# Patient Record
Sex: Male | Born: 1985
Health system: Southern US, Community
[De-identification: ages and names within clinical notes are randomized; demographics above are authoritative.]

---

## 2015-12-02 ENCOUNTER — Encounter: Payer: Self-pay | Admitting: Orthopaedic Surgery

## 2015-12-02 ENCOUNTER — Ambulatory Visit (INDEPENDENT_AMBULATORY_CARE_PROVIDER_SITE_OTHER): Payer: 59 | Admitting: Orthopaedic Surgery

## 2015-12-02 VITALS — BP 115/70 | HR 77 | Temp 98.1°F | Ht 74.0 in | Wt 190.0 lb

## 2015-12-02 DIAGNOSIS — S66911A Strain of unspecified muscle, fascia and tendon at wrist and hand level, right hand, initial encounter: Secondary | ICD-10-CM

## 2015-12-02 NOTE — Progress Notes (Signed)
Subjective: I hurt my right forearm and wrist while pulling on fire hose about ten days ago    Patient ID: Phillip Snyder, male    DOB: 1985/10/29, 30 y.o.   MRN: [id number]  Wrist Pain  The pain is present in the right arm and right wrist. This is a new problem. The current episode started 1 to 4 weeks ago. There has been a history of trauma. The problem occurs intermittently. The problem has been gradually improving. The quality of the pain is described as aching. The pain is at a severity of 2/10. The pain is mild. The symptoms are aggravated by activity. He has tried nothing for the symptoms.   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  About ten days ago, he helped pull about 2000 feet of fire hose out and lay it on the ground for inspection.  He had to pull on it very hard.  He did well doing it but later that evening he had dorsal swelling of the right dominant forearm between wrist and elbow.  He had some tenderness.  He just watched it.  He did not use ice or heat.  He did take one or two Aleve total.  Over the last ten days the area has gone done but he is concerned he did something bad and wants it checked out.  He has never had this before.    He has no numbness, no loss of motion, no redness, no joint pain.  He has no pain to the right elbow or upper arm or shoulder.    Review of Systems  HENT: Negative for congestion.   Respiratory: Negative for cough and shortness of breath.   Cardiovascular: Negative for chest pain and leg swelling.  Endocrine: Negative for cold intolerance.  Musculoskeletal: Positive for arthralgias.  Allergic/Immunologic: Negative for environmental allergies.  All other systems reviewed and are negative.  History reviewed. No pertinent past medical history.  History reviewed. No pertinent past surgical history.  No current outpatient prescriptions on file prior to visit.   No current facility-administered medications on file prior  to visit.    Social History   Social History  . Marital Status: Unknown    Spouse Name: N/A  . Number of Children: N/A  . Years of Education: N/A   Occupational History  . Not on file.   Social History Main Topics  . Smoking status: Not on file  . Smokeless tobacco: Not on file  . Alcohol Use: Not on file  . Drug Use: Not on file  . Sexual Activity: Not on file   Other Topics Concern  . Not on file   Social History Narrative  . No narrative on file    BP 115/70 mmHg  Pulse 77  Temp(Src) 98.1 F (36.7 C)  Ht  (1.88 m)  Wt 190 lb (86.183 kg)  BMI 24.38 kg/m2     Objective:   Physical Exam  Constitutional: He is oriented to person, place, and time. He appears well-developed and well-nourished.  HENT:  Head: Normocephalic and atraumatic.  Eyes: Conjunctivae and EOM are normal. Pupils are equal, round, and reactive to light.  Neck: Normal range of motion. Neck supple.  Cardiovascular: Normal rate, regular rhythm and intact distal pulses.   Pulmonary/Chest: Effort normal.  Abdominal: Soft.  Musculoskeletal: He exhibits tenderness (He has slight dorsal swelling of the right forearm with his slight tenderness sensation felt with reisitance of wrist extensors.  He has normal  NV status.  Grips are normal.  ROM full.).  Neurological: He is alert and oriented to person, place, and time. He has normal reflexes. No cranial nerve deficit. He exhibits normal muscle tone. Coordination normal.  Skin: Skin is warm and dry.  Psychiatric: He has a normal mood and affect. His behavior is normal. Judgment and thought content normal.          Assessment & Plan:   Encounter Diagnosis  Name Primary?  . Strain of right wrist, initial encounter Yes   I believe he has strain of the extensor carpi radialis longus and brevis on the right (second compartment of right hand extensors) that is resolving.  I have shown him drawings of the area.  I have told him to be careful with  any strenuous pulling over the next few weeks.  Use ice and Aleve if he has a problem.  I will see him as needed.  Call if any problem.

## 2015-12-02 NOTE — Patient Instructions (Signed)
Use ice and take aleve one two times a day Avoid pulling activity for the next ten days

## 2016-07-05 ENCOUNTER — Ambulatory Visit (HOSPITAL_COMMUNITY)
Admission: EM | Admit: 2016-07-05 | Discharge: 2016-07-05 | Disposition: A | Discharge status: Home / Self Care | Payer: 59 | Attending: Family Medicine | Admitting: Family Medicine

## 2016-07-05 ENCOUNTER — Encounter (HOSPITAL_COMMUNITY): Payer: Self-pay | Admitting: Emergency Medicine

## 2016-07-05 DIAGNOSIS — J069 Acute upper respiratory infection, unspecified: Secondary | ICD-10-CM | POA: Diagnosis not present

## 2016-07-05 MED ORDER — IPRATROPIUM BROMIDE 0.06 % NA SOLN: 2.0000 | Freq: Four times a day (QID) | NASAL | 1 refills | Status: AC

## 2016-07-05 NOTE — ED Triage Notes (Signed)
[facility] a [facility] of a sore throat , runny nose and sinus pressure x 3 days.

## 2016-07-05 NOTE — Discharge Instructions (Signed)
Drink plenty of fluids as discussed, use medicine as prescribed, and mucinex or delsym for cough. Return or see your doctor if further problems °

## 2016-07-05 NOTE — ED Provider Notes (Signed)
[facility]    CSN: [id number] Arrival date & time: 07/05/16  1929     History   Chief Complaint Chief Complaint  Patient presents with  . Sore Throat    HPI Phillip Snyder is a 30 y.o. male.   The history is provided by the patient.  Sore Throat  This is a new problem. The current episode started more than 2 days ago. The problem has been gradually worsening. Pertinent negatives include no chest pain, no abdominal pain, no headaches and no shortness of breath.    History reviewed. No pertinent past medical history.  There are no active problems to display for this patient.   History reviewed. No pertinent surgical history.     Home Medications    Prior to Admission medications   Medication Sig Start Date End Date Taking? Authorizing Provider  fexofenadine (ALLEGRA) 180 MG tablet Take 180 mg by mouth daily.   Yes Historical Provider, MD  ipratropium (ATROVENT) 0.06 % nasal spray Place 2 sprays into both nostrils 4 (four) times daily. 07/05/16   Linna HoffJames D Kindl, MD    Family History History reviewed. No pertinent family history.  Social History Social History  Substance Use Topics  . Smoking status: Never Smoker  . Smokeless tobacco: Never Used  . Alcohol use No     Allergies   Patient has no known allergies.   Review of Systems Review of Systems  Constitutional: Negative for activity change, appetite change, chills and fever.  HENT: Positive for congestion, postnasal drip, rhinorrhea and sore throat.   Respiratory: Negative.  Negative for shortness of breath.   Cardiovascular: Negative.  Negative for chest pain.  Gastrointestinal: Negative.  Negative for abdominal pain.  Neurological: Negative for headaches.  All other systems reviewed and are negative.    Physical Exam Triage Vital Signs ED Triage Vitals  Enc Vitals Group     BP 07/05/16 2002 123/71     Pulse Rate 07/05/16 2002 77     Resp 07/05/16 2002 16     Temp 07/05/16  2002 98.3 F (36.8 C)     Temp Source 07/05/16 2002 Oral     SpO2 07/05/16 2002 100 %     Weight --      Height --      Head Circumference --      Peak Flow --      Pain Score 07/05/16 2005 5     Pain Loc --      Pain Edu? --      Excl. in GC? --    No data found.   Updated Vital Signs BP 123/71 (BP Location: Right Arm)   Pulse 77   Temp 98.3 F (36.8 C) (Oral)   Resp 16   SpO2 100%   Visual Acuity Right Eye Distance:   Left Eye Distance:   Bilateral Distance:    Right Eye Near:   Left Eye Near:    Bilateral Near:     Physical Exam  Constitutional: He is oriented to person, place, and time. He appears well-developed and well-nourished.  HENT:  Right Ear: External ear normal.  Left Ear: External ear normal.  Nose: Nose normal.  Mouth/Throat: Oropharynx is clear and moist.  Eyes: Pupils are equal, round, and reactive to light.  Neck: Normal range of motion. Neck supple.  Cardiovascular: Normal rate, regular rhythm, normal heart sounds and intact distal pulses.   Pulmonary/Chest: Effort normal and breath sounds normal.  Lymphadenopathy:  He has no cervical adenopathy.  Neurological: He is alert and oriented to person, place, and time.  Skin: Skin is warm and dry.  Nursing note and vitals reviewed.    UC Treatments / Results  Labs (all labs ordered are listed, but only abnormal results are displayed) Labs Reviewed - No data to display  EKG  EKG Interpretation None       Radiology No results found.  Procedures Procedures (including critical care time)  Medications Ordered in UC Medications - No data to display   Initial Impression / Assessment and Plan / UC Course  I have reviewed the triage vital signs and the nursing notes.  Pertinent labs & imaging results that were available during my care of the patient were reviewed by me and considered in my medical decision making (see chart for details).  Clinical Course      Final Clinical  Impressions(s) / UC Diagnoses   Final diagnoses:  Upper respiratory tract infection, unspecified type    New Prescriptions Discharge Medication List as of 07/05/2016  8:26 PM    START taking these medications   Details  ipratropium (ATROVENT) 0.06 % nasal spray Place 2 sprays into both nostrils 4 (four) times daily., Starting Tue 07/05/2016, Print         Linna HoffJames D Kindl, MD 07/10/16 [phone]

## 2016-09-12 ENCOUNTER — Emergency Department (HOSPITAL_COMMUNITY)
Admission: EM | Admit: 2016-09-12 | Discharge: 2016-09-12 | Disposition: A | Discharge status: Home / Self Care | Payer: Worker's Compensation | Attending: Emergency Medicine | Admitting: Emergency Medicine

## 2016-09-12 ENCOUNTER — Emergency Department (HOSPITAL_COMMUNITY): Payer: Worker's Compensation

## 2016-09-12 ENCOUNTER — Encounter (HOSPITAL_COMMUNITY): Payer: Self-pay | Admitting: Emergency Medicine

## 2016-09-12 DIAGNOSIS — S6710XA Crushing injury of unspecified finger(s), initial encounter: Secondary | ICD-10-CM

## 2016-09-12 DIAGNOSIS — S61316A Laceration without foreign body of right little finger with damage to nail, initial encounter: Secondary | ICD-10-CM | POA: Insufficient documentation

## 2016-09-12 DIAGNOSIS — W269XXA Contact with unspecified sharp object(s), initial encounter: Secondary | ICD-10-CM | POA: Diagnosis not present

## 2016-09-12 DIAGNOSIS — Y990 Civilian activity done for income or pay: Secondary | ICD-10-CM | POA: Diagnosis not present

## 2016-09-12 DIAGNOSIS — Y9289 Other specified places as the place of occurrence of the external cause: Secondary | ICD-10-CM | POA: Diagnosis not present

## 2016-09-12 DIAGNOSIS — Y9389 Activity, other specified: Secondary | ICD-10-CM | POA: Diagnosis not present

## 2016-09-12 DIAGNOSIS — R52 Pain, unspecified: Secondary | ICD-10-CM

## 2016-09-12 DIAGNOSIS — S61319A Laceration without foreign body of unspecified finger with damage to nail, initial encounter: Secondary | ICD-10-CM

## 2016-09-12 DIAGNOSIS — T148XXA Other injury of unspecified body region, initial encounter: Secondary | ICD-10-CM

## 2016-09-12 LAB — I-STAT CHEM 8, ED
BUN: 13 mg/dL (ref 6–20)
Calcium, Ion: 1.15 mmol/L (ref 1.15–1.40)
Chloride: 102 mmol/L (ref 101–111)
Creatinine, Ser: 0.9 mg/dL (ref 0.61–1.24)
Glucose, Bld: 106 mg/dL — ABNORMAL HIGH (ref 65–99)
HEMATOCRIT: 48 % (ref 39.0–52.0)
HEMOGLOBIN: 16.3 g/dL (ref 13.0–17.0)
Potassium: 4 mmol/L (ref 3.5–5.1)
SODIUM: 141 mmol/L (ref 135–145)
TCO2: 31 mmol/L (ref 0–100)

## 2016-09-12 MED ORDER — ACETAMINOPHEN 500 MG PO TABS: 1000.0000 mg | ORAL_TABLET | Freq: Three times a day (TID) | ORAL | 0 refills | Status: AC

## 2016-09-12 MED ORDER — CEFAZOLIN IN D5W 1 GM/50ML IV SOLN
1.0000 g | Freq: Once | INTRAVENOUS | Status: AC
  Administered 2016-09-12: 1 g via INTRAVENOUS
  Filled 2016-09-12: qty 50

## 2016-09-12 MED ORDER — ACETAMINOPHEN 500 MG PO TABS
1000.0000 mg | ORAL_TABLET | Freq: Once | ORAL | Status: AC
  Administered 2016-09-12: 1000 mg via ORAL
  Filled 2016-09-12: qty 2

## 2016-09-12 MED ORDER — LIDOCAINE HCL 1 % IJ SOLN
10.0000 mL | Freq: Once | INTRAMUSCULAR | Status: AC
  Administered 2016-09-12: 10 mL via INTRADERMAL
  Filled 2016-09-12: qty 10

## 2016-09-12 MED ORDER — CEPHALEXIN 500 MG PO CAPS: 500.0000 mg | ORAL_CAPSULE | Freq: Three times a day (TID) | ORAL | 0 refills | Status: AC

## 2016-09-12 MED ORDER — HYDROCODONE-ACETAMINOPHEN 5-325 MG PO TABS: 1.0000 | ORAL_TABLET | Freq: Three times a day (TID) | ORAL | 0 refills | Status: AC | PRN

## 2016-09-12 MED ORDER — HYDROCODONE-ACETAMINOPHEN 5-325 MG PO TABS
1.0000 | ORAL_TABLET | Freq: Once | ORAL | Status: AC
Start: 2016-09-12 — End: 2016-09-12
  Administered 2016-09-12: 1 via ORAL
  Filled 2016-09-12: qty 1

## 2016-09-12 NOTE — ED Triage Notes (Signed)
Pt reports cutting his right pinky finger while working. Unsure if tetanus shot is up to date or not. Bleeding controlled at this time.

## 2016-09-12 NOTE — ED Provider Notes (Signed)
[facility]   CSN: [id number] Arrival date & time: 09/12/16  0940     History   Chief Complaint Chief Complaint  Patient presents with  . Extremity Laceration    HPI Phillip Snyder is a 31 y.o. male.  The history is provided by the patient.  Hand Injury   The incident occurred 1 to 2 hours ago. The incident occurred at work. Injury mechanism: crush. Pain location: right small and ring finger. The pain is moderate. The pain has been constant since the incident. Pertinent negatives include no fever. The symptoms are aggravated by movement, palpation and use. He has tried nothing for the symptoms.   Tetanus UTD within the last 5 yrs.   History reviewed. No pertinent past medical history.  There are no active problems to display for this patient.   History reviewed. No pertinent surgical history.     Home Medications    Prior to Admission medications   Medication Sig Start Date End Date Taking? Authorizing Provider  diphenhydrAMINE (BENADRYL) 25 MG tablet Take 25 mg by mouth every 6 (six) hours as needed for allergies.   Yes Historical Provider, MD  acetaminophen (TYLENOL) 500 MG tablet Take 2 tablets (1,000 mg total) by mouth every 8 (eight) hours. Do not take more than 4000 mg of acetaminophen (Tylenol) in a 24-hour period. Please note that other medicines that you may be prescribed may have Tylenol as well. 09/12/16 09/17/16  Nira Conn, MD  cephALEXin (KEFLEX) 500 MG capsule Take 1 capsule (500 mg total) by mouth 3 (three) times daily. 09/12/16 09/19/16  Nira Conn, MD  HYDROcodone-acetaminophen (NORCO/VICODIN) 5-325 MG tablet Take 1 tablet by mouth every 8 (eight) hours as needed for severe pain (That is not improved by your scheduled acetaminophen regimen). Please do not exceed 4000 mg of acetaminophen (Tylenol) a 24-hour period. Please note that he may be prescribed additional medicine that contains acetaminophen. 09/12/16 09/17/16   Nira Conn, MD  ipratropium (ATROVENT) 0.06 % nasal spray Place 2 sprays into both nostrils 4 (four) times daily. Patient not taking: Reported on 09/12/2016 07/05/16   Linna Hoff, MD    Family History No family history on file.  Social History Social History  Substance Use Topics  . Smoking status: Never Smoker  . Smokeless tobacco: Never Used  . Alcohol use No     Allergies   Patient has no known allergies.   Review of Systems Review of Systems  Constitutional: Negative for fever.  Ten systems are reviewed and are negative for acute change except as noted in the HPI    Physical Exam Updated Vital Signs BP 137/78 (BP Location: Left Arm)   Pulse 97   Temp 98.9 F (37.2 C) (Oral)   Resp 20   Ht 6\' 2"  (1.88 m)   Wt 205 lb (93 kg)   SpO2 98%   BMI 26.32 kg/m   Physical Exam  Constitutional: He is oriented to person, place, and time. He appears well-developed and well-nourished. No distress.  HENT:  Head: Normocephalic and atraumatic.  Right Ear: External ear normal.  Left Ear: External ear normal.  Nose: Nose normal.  Mouth/Throat: Mucous membranes are normal. No trismus in the jaw.  Eyes: Conjunctivae and EOM are normal. No scleral icterus.  Neck: Normal range of motion and phonation normal.  Cardiovascular: Normal rate and regular rhythm.   Pulmonary/Chest: Effort normal. No stridor. No respiratory distress.  Abdominal: He exhibits no distension.  Musculoskeletal: Normal range of motion. He exhibits no edema.       Right hand: He exhibits laceration.       Hands: Neurological: He is alert and oriented to person, place, and time.  Skin: He is not diaphoretic.  Psychiatric: He has a normal mood and affect. His behavior is normal.  Vitals reviewed.      ED Treatments / Results  Labs (all labs ordered are listed, but only abnormal results are displayed) Labs Reviewed  I-STAT CHEM 8, ED - Abnormal; Notable for the following:       Result  Value   Glucose, Bld 106 (*)    All other components within normal limits    EKG  EKG Interpretation None       Radiology Dg Hand Complete Right  Result Date: 09/12/2016 CLINICAL DATA:  Right hand pain secondary to crush injury today. EXAM: RIGHT HAND - COMPLETE 3+ VIEW COMPARISON:  None. FINDINGS: There appears to be an old deformity of the tip of the right little finger. No fractures or dislocations. No appreciable arthritis. IMPRESSION: Probable old deformity of the tip of the little finger. No visible acute abnormalities. Electronically Signed   By: Francene Boyers M.D.   On: 09/12/2016 10:46    Procedures .Marland KitchenLaceration Repair Date/Time: 09/12/2016 3:54 PM Performed by: Nira Conn Authorized by: Nira Conn   Consent:    Consent obtained:  Verbal   Consent given by:  Patient   Risks discussed:  Poor cosmetic result, poor wound healing and infection   Alternatives discussed:  No treatment and delayed treatment Anesthesia (see MAR for exact dosages):    Anesthesia method:  Nerve block   Block needle gauge:  27 G   Block anesthetic:  Lidocaine 1% w/o epi   Block technique:  7   Block injection procedure:  Anatomic landmarks identified, incremental injection and introduced needle   Block outcome:  Anesthesia achieved Laceration details:    Location:  Finger   Finger location:  R small finger   Length (cm):  1 Repair type:    Repair type:  Complex Pre-procedure details:    Preparation:  Patient was prepped and draped in usual sterile fashion and imaging obtained to evaluate for foreign bodies Exploration:    Hemostasis achieved with:  Tourniquet   Wound exploration: wound explored through full range of motion and entire depth of wound probed and visualized     Wound extent: underlying fracture   Treatment:    Area cleansed with:  Betadine   Amount of cleaning:  Extensive   Irrigation solution:  Sterile saline   Irrigation volume:  1.5 L    Irrigation method:  Syringe   Debridement:  Minimal Skin repair:    Repair method:  Sutures   Suture size: 5-0 and 6-0 chromic gut and 4-0 vicryl rapid,    Suture technique:  Simple interrupted   Number of sutures:  7 Approximation:    Approximation:  Close   Vermilion border: well-aligned   Post-procedure details:    Dressing:  Non-adherent dressing, bulky dressing and splint for protection   Patient tolerance of procedure:  Tolerated well, no immediate complications Comments:     3 sutures were placed in the nail bed. 1 suture on lateral aspect of finger. 3 sutures on nail.    (including critical care time)  Medications Ordered in ED Medications  acetaminophen (TYLENOL) tablet 1,000 mg (1,000 mg Oral Given 09/12/16 1045)  ceFAZolin (ANCEF) IVPB 1  g/50 mL premix (0 g Intravenous Stopped 09/12/16 1329)  lidocaine (XYLOCAINE) 1 % (with pres) injection 10 mL (10 mLs Intradermal Given 09/12/16 1349)  HYDROcodone-acetaminophen (NORCO/VICODIN) 5-325 MG per tablet 1 tablet (1 tablet Oral Given 09/12/16 1557)     Initial Impression / Assessment and Plan / ED Course  I have reviewed the triage vital signs and the nursing notes.  Pertinent labs & imaging results that were available during my care of the patient were reviewed by me and considered in my medical decision making (see chart for details).     Crush injury resulting in nail bed injury. Plain film with with fracture of the distal phalanx. Treated as an open fracture and patient given Ancef. Up-to-date on tetanus vaccination. Discussed case with Dr. Janee Mornhompson who agreed with the closure in the emergency department and close follow-up in clinic. Injury repaired as above.  Patient also with ring finger pulp hematoma with intact capillary refill.    Patient will follow-up with Dr. Janee Mornhompson for further management.  The patient is safe for discharge with strict return precautions.   Final Clinical Impressions(s) / ED Diagnoses    Final diagnoses:  Pain  Laceration of finger nail bed, initial encounter  Crushing injury of finger, initial encounter  Hematoma   Disposition: Discharge  Condition: Good  I have discussed the results, Dx and Tx plan with the patient who expressed understanding and agree(s) with the plan. Discharge instructions discussed at great length. The patient was given strict return precautions who verbalized understanding of the instructions. No further questions at time of discharge.    New Prescriptions   ACETAMINOPHEN (TYLENOL) 500 MG TABLET    Take 2 tablets (1,000 mg total) by mouth every 8 (eight) hours. Do not take more than 4000 mg of acetaminophen (Tylenol) in a 24-hour period. Please note that other medicines that you may be prescribed may have Tylenol as well.   CEPHALEXIN (KEFLEX) 500 MG CAPSULE    Take 1 capsule (500 mg total) by mouth 3 (three) times daily.   HYDROCODONE-ACETAMINOPHEN (NORCO/VICODIN) 5-325 MG TABLET    Take 1 tablet by mouth every 8 (eight) hours as needed for severe pain (That is not improved by your scheduled acetaminophen regimen). Please do not exceed 4000 mg of acetaminophen (Tylenol) a 24-hour period. Please note that he may be prescribed additional medicine that contains acetaminophen.    Follow Up: Mack Hookavid Thompson, MD [address] [place] [address] [phone]   Please call if you have not heard from the hand surgeon in 2-3 days.  Assunta FoundJohn Golding, MD [address] [place] [address] [phone]   As needed      Nira ConnPedro Eduardo Riely Baskett, MD 09/12/16 [phone]

## 2016-09-12 NOTE — ED Notes (Signed)
Patient transported to X-ray 

## 2016-11-01 DIAGNOSIS — E782 Mixed hyperlipidemia: Secondary | ICD-10-CM | POA: Diagnosis not present

## 2016-11-01 DIAGNOSIS — Z1389 Encounter for screening for other disorder: Secondary | ICD-10-CM | POA: Diagnosis not present

## 2016-11-01 DIAGNOSIS — Z0000 Encounter for general adult medical examination without abnormal findings: Secondary | ICD-10-CM | POA: Diagnosis not present

## 2017-01-23 DIAGNOSIS — S134XXA Sprain of ligaments of cervical spine, initial encounter: Secondary | ICD-10-CM | POA: Diagnosis not present

## 2017-01-23 DIAGNOSIS — M4004 Postural kyphosis, thoracic region: Secondary | ICD-10-CM | POA: Diagnosis not present

## 2017-01-23 DIAGNOSIS — M546 Pain in thoracic spine: Secondary | ICD-10-CM | POA: Diagnosis not present

## 2017-05-10 DIAGNOSIS — Z23 Encounter for immunization: Secondary | ICD-10-CM | POA: Diagnosis not present

## 2017-09-28 DIAGNOSIS — R509 Fever, unspecified: Secondary | ICD-10-CM | POA: Diagnosis not present

## 2017-09-28 DIAGNOSIS — Z1389 Encounter for screening for other disorder: Secondary | ICD-10-CM | POA: Diagnosis not present

## 2017-09-28 DIAGNOSIS — K529 Noninfective gastroenteritis and colitis, unspecified: Secondary | ICD-10-CM | POA: Diagnosis not present

## 2017-10-05 DIAGNOSIS — H109 Unspecified conjunctivitis: Secondary | ICD-10-CM | POA: Diagnosis not present

## 2017-10-05 DIAGNOSIS — J019 Acute sinusitis, unspecified: Secondary | ICD-10-CM | POA: Diagnosis not present

## 2017-11-30 DIAGNOSIS — L308 Other specified dermatitis: Secondary | ICD-10-CM | POA: Diagnosis not present

## 2017-11-30 DIAGNOSIS — B078 Other viral warts: Secondary | ICD-10-CM | POA: Diagnosis not present

## 2018-02-09 DIAGNOSIS — Z0000 Encounter for general adult medical examination without abnormal findings: Secondary | ICD-10-CM | POA: Diagnosis not present

## 2018-02-09 DIAGNOSIS — Z23 Encounter for immunization: Secondary | ICD-10-CM | POA: Diagnosis not present

## 2018-02-12 DIAGNOSIS — Z23 Encounter for immunization: Secondary | ICD-10-CM | POA: Diagnosis not present

## 2018-02-12 DIAGNOSIS — Z0000 Encounter for general adult medical examination without abnormal findings: Secondary | ICD-10-CM | POA: Diagnosis not present

## 2018-02-12 DIAGNOSIS — E782 Mixed hyperlipidemia: Secondary | ICD-10-CM | POA: Diagnosis not present

## 2020-03-26 ENCOUNTER — Other Ambulatory Visit (HOSPITAL_COMMUNITY): Payer: Self-pay | Admitting: Family Medicine

## 2020-03-26 ENCOUNTER — Ambulatory Visit (HOSPITAL_COMMUNITY)
Admission: RE | Admit: 2020-03-26 | Discharge: 2020-03-26 | Disposition: A | Discharge status: Home / Self Care | Payer: 59 | Source: Ambulatory Visit | Attending: Family Medicine | Admitting: Family Medicine

## 2020-03-26 ENCOUNTER — Other Ambulatory Visit: Payer: Self-pay

## 2020-03-26 DIAGNOSIS — M25511 Pain in right shoulder: Secondary | ICD-10-CM

## 2020-11-06 ENCOUNTER — Encounter: Payer: Self-pay | Admitting: Emergency Medicine

## 2020-11-06 ENCOUNTER — Ambulatory Visit
Admission: EM | Admit: 2020-11-06 | Discharge: 2020-11-06 | Disposition: A | Discharge status: Home / Self Care | Payer: 59 | Attending: Internal Medicine | Admitting: Internal Medicine

## 2020-11-06 ENCOUNTER — Other Ambulatory Visit: Payer: Self-pay

## 2020-11-06 DIAGNOSIS — B309 Viral conjunctivitis, unspecified: Secondary | ICD-10-CM

## 2020-11-06 DIAGNOSIS — H68003 Unspecified Eustachian salpingitis, bilateral: Secondary | ICD-10-CM | POA: Diagnosis not present

## 2020-11-06 MED ORDER — FLUTICASONE PROPIONATE 50 MCG/ACT NA SUSP: 2.0000 | Freq: Every day | NASAL | 0 refills | Status: AC

## 2020-11-06 MED ORDER — ERYTHROMYCIN 5 MG/GM OP OINT: TOPICAL_OINTMENT | OPHTHALMIC | 0 refills | Status: AC

## 2020-11-06 NOTE — ED Triage Notes (Signed)
Nasal congestion and cough x 1 week.  Left eye red and itchy.  Bilateral ear pain.

## 2020-11-06 NOTE — ED Provider Notes (Signed)
[facility]    CSN: [id number] Arrival date & time: 11/06/20  1415      History   Chief Complaint No chief complaint on file.   HPI Phillip Snyder is a 35 y.o. male who presents with redness of L eye.  Had body aches and fever for 2 days 10 days ago, then after that he developed nose congestion, and cough. Then 2 days ago his  L eye turned red with crusting when he wakes up, but no purulent drainage during the day. Has also been having bilateral ear pain pressure off and on and his wife gave him some drops to use, but he does not know what they are for.  History reviewed. No pertinent past medical history.  There are no problems to display for this patient.   History reviewed. No pertinent surgical history.   Home Medications    Prior to Admission medications   Medication Sig Start Date End Date Taking? Authorizing Provider  erythromycin ophthalmic ointment Place a 1/2 inch ribbon of ointment into the lower  L eyelid x 7 days 11/08/20  Yes Rodriguez-Southworth, Nettie Elm, PA-C  fluticasone (FLONASE) 50 MCG/ACT nasal spray Place 2 sprays into both nostrils daily. 11/06/20  Yes Rodriguez-Southworth, Nettie Elm, PA-C  ipratropium (ATROVENT) 0.06 % nasal spray Place 2 sprays into both nostrils 4 (four) times daily. Patient not taking: Reported on 09/12/2016 07/05/16   Linna Hoff, MD  diphenhydrAMINE (BENADRYL) 25 MG tablet Take 25 mg by mouth every 6 (six) hours as needed for allergies.  11/06/20  [provider]    Family History Family History  Problem Relation Age of Onset  . Healthy Mother   . Hyperlipidemia Father     Social History Social History   Tobacco Use  . Smoking status: Never Smoker  . Smokeless tobacco: Never Used  Substance Use Topics  . Alcohol use: No  . Drug use: No     Allergies   Patient has no known allergies.   Review of Systems Review of Systems  Constitutional: Negative for chills, diaphoresis and fever.  HENT:  Positive for congestion, ear pain and postnasal drip. Negative for ear discharge and sore throat.   Eyes: Positive for discharge, redness and itching. Negative for photophobia and pain.  Respiratory: Positive for cough. Negative for chest tightness and shortness of breath.   Musculoskeletal: Negative for myalgias.  Skin: Negative for rash.  Neurological: Negative for headaches.  Hematological: Negative for adenopathy.     Physical Exam Triage Vital Signs ED Triage Vitals  Enc Vitals Group     BP 11/06/20 1425 (!) 143/75     Pulse Rate 11/06/20 1425 93     Resp 11/06/20 1425 18     Temp 11/06/20 1425 98.2 F (36.8 C)     Temp Source 11/06/20 1425 Oral     SpO2 11/06/20 1425 96 %     Weight --      Height --      Head Circumference --      Peak Flow --      Pain Score 11/06/20 1426 5     Pain Loc --      Pain Edu? --      Excl. in GC? --    No data found.  Updated Vital Signs BP (!) 143/75 (BP Location: Right Arm)   Pulse 93   Temp 98.2 F (36.8 C) (Oral)   Resp 18   SpO2 96%   Visual Acuity Right  Eye Distance:   Left Eye Distance:   Bilateral Distance:    Right Eye Near:   Left Eye Near:    Bilateral Near:     Physical Exam Vitals and nursing note reviewed.  Constitutional:      General: He is not in acute distress.    Appearance: He is normal weight. He is not toxic-appearing.  HENT:     Head: Normocephalic.     Right Ear: Tympanic membrane, ear canal and external ear normal.     Left Ear: Ear canal and external ear normal.     Ears:     Comments: Has scarring of R TM L TM is a little dull    Nose: Congestion present.     Mouth/Throat:     Mouth: Mucous membranes are moist.     Pharynx: Oropharynx is clear.  Eyes:     General: No scleral icterus.       Right eye: No discharge.        Left eye: No discharge.     Extraocular Movements: Extraocular movements intact.     Pupils: Pupils are equal, round, and reactive to light.     Comments: L sclera  little injected, but no perilimbal erythema present or active drainage, not even tearing.  R eye is normal  Cardiovascular:     Rate and Rhythm: Normal rate and regular rhythm.     Heart sounds: No murmur heard.   Pulmonary:     Effort: Pulmonary effort is normal.     Breath sounds: Normal breath sounds.  Musculoskeletal:        General: Normal range of motion.     Cervical back: Neck supple.  Lymphadenopathy:     Cervical: No cervical adenopathy.  Skin:    General: Skin is warm and dry.  Neurological:     Mental Status: He is alert and oriented to person, place, and time.  Psychiatric:        Mood and Affect: Mood normal.        Behavior: Behavior normal.        Thought Content: Thought content normal.        Judgment: Judgment normal.      UC Treatments / Results  Labs (all labs ordered are listed, but only abnormal results are displayed) Labs Reviewed - No data to display  EKG   Radiology No results found.  Procedures Procedures (including critical care time)  Medications Ordered in UC Medications - No data to display  Initial Impression / Assessment and Plan / UC Course  I have reviewed the triage vital signs and the nursing notes. Has UR, L viral conjunctivitis and SOM. I advised saline eye drops for his L eye, if he develops purulent drainage during the day, then may fill the eye antibiotic ointment. I also placed him on Flonase to help with his ear. See instructions.    Final Clinical Impressions(s) / UC Diagnoses   Final diagnoses:  Viral conjunctivitis, left eye  Eustachian catarrh, bilateral     Discharge Instructions     Use saline eye drops 2 drops 4 times a day for 5 days If you develop puss draining actively during the day, then fill the prescription for the antibiotic ointment. Right now you only have the viral conjunctivitis and the saline will take care of this.   If you develop a fever of 100.5 or more , and ear pain please come back.      ED  Prescriptions    Medication Sig Dispense Auth. Provider   fluticasone (FLONASE) 50 MCG/ACT nasal spray Place 2 sprays into both nostrils daily. 18.2 g Rodriguez-Southworth, Sheilla Maris, PA-C   erythromycin ophthalmic ointment Place a 1/2 inch ribbon of ointment into the lower  L eyelid x 7 days 3.5 g Rodriguez-Southworth, Nettie Elm, PA-C     PDMP not reviewed this encounter.   Garey Ham, PA-C 11/06/20 1452

## 2020-11-06 NOTE — Discharge Instructions (Addendum)
Use saline eye drops 2 drops 4 times a day for 5 days If you develop puss draining actively during the day, then fill the prescription for the antibiotic ointment. Right now you only have the viral conjunctivitis and the saline will take care of this.   If you develop a fever of 100.5 or more , and ear pain please come back.

## 2021-01-28 IMAGING — DX DG SHOULDER 2+V*R*
3 series · 3 of 3 positions shown · non-contrast
Comparison: None.

CLINICAL DATA: Right shoulder pain, onset after working out
yesterday.

EXAM:
RIGHT SHOULDER - 2+ VIEW

[shoulder grashey]
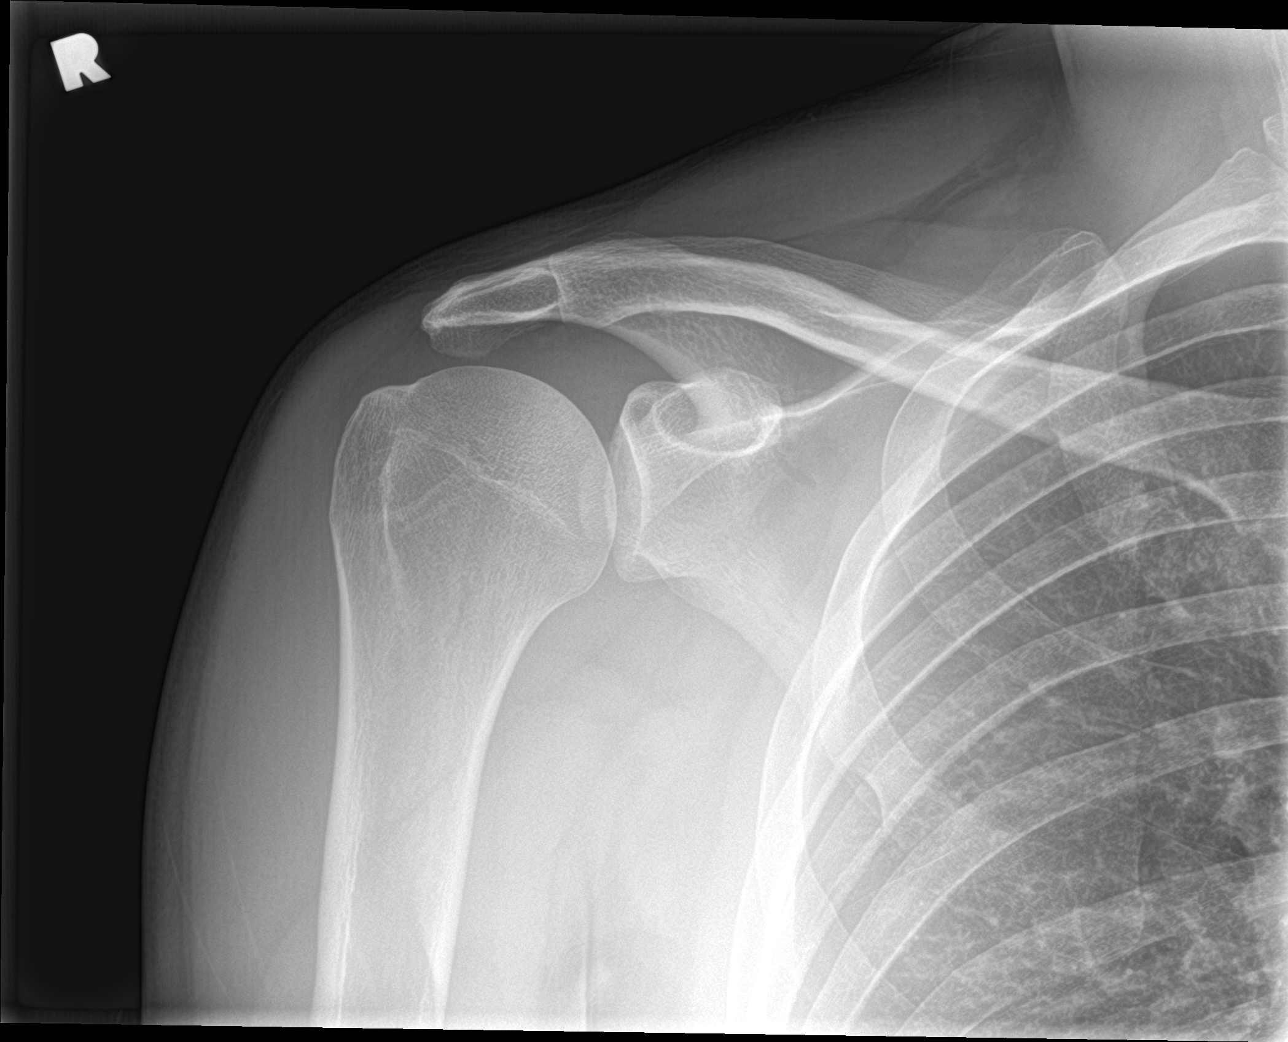

[shoulder y view]
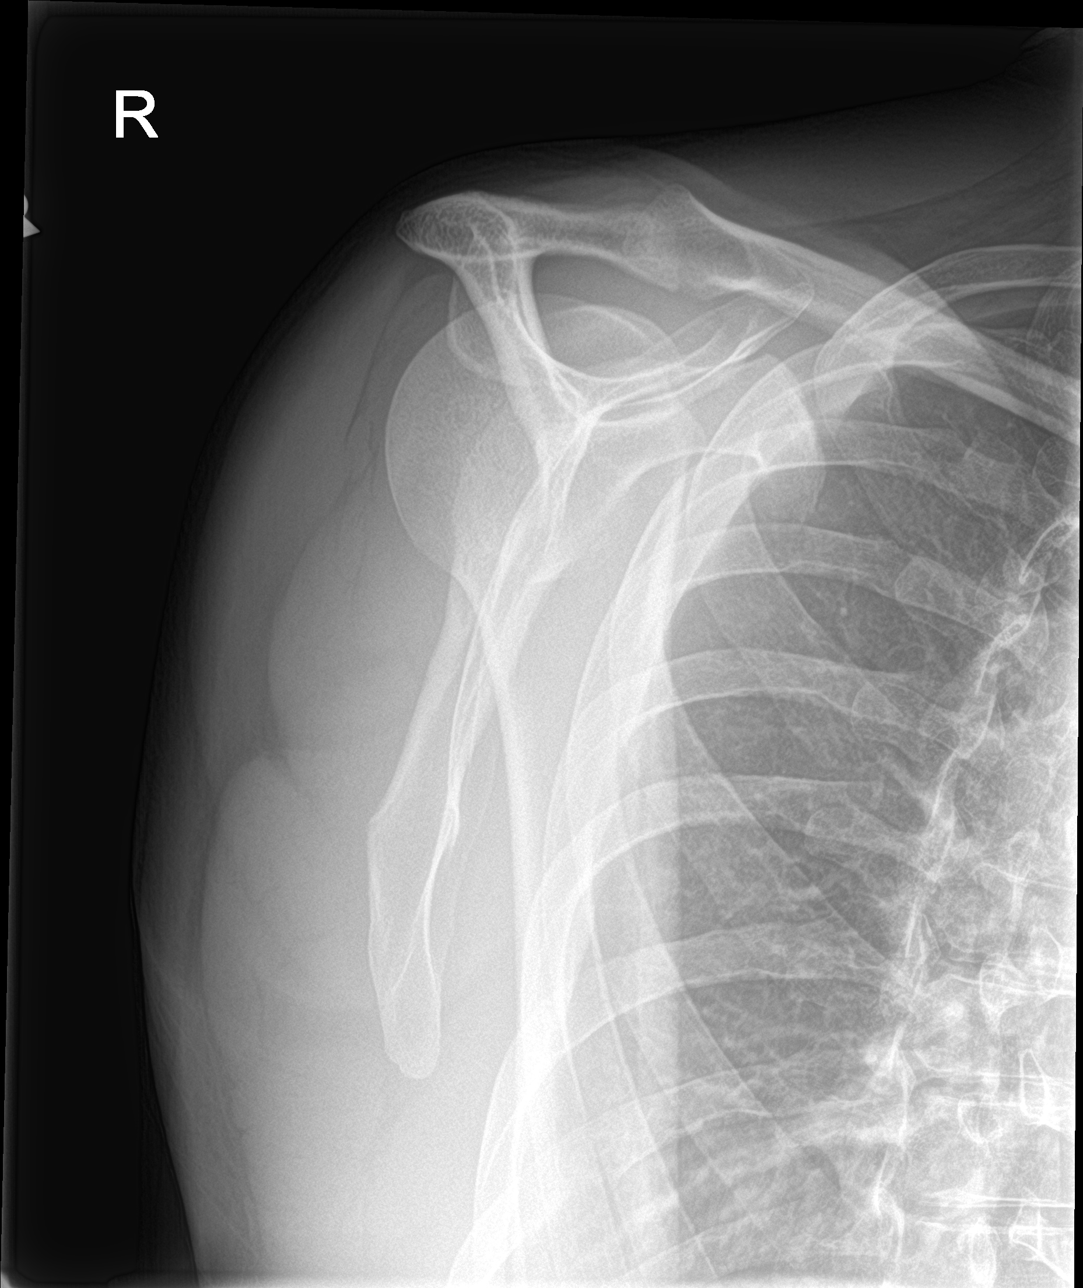

[shoulder axillary]
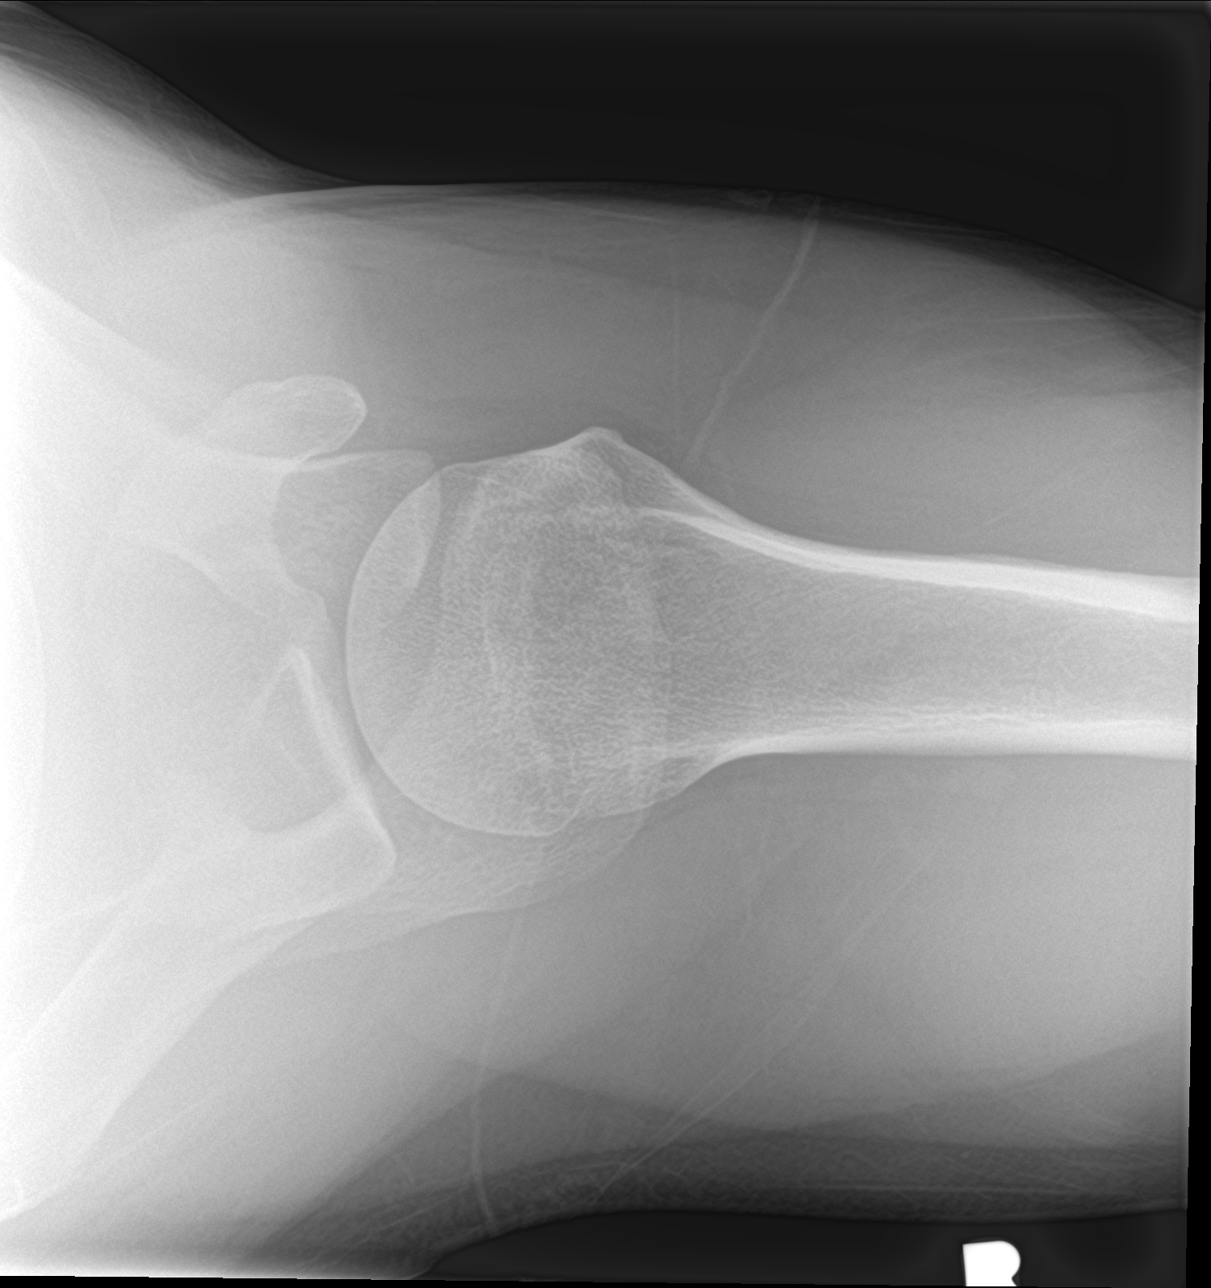

[3 of 3 positions shown; findings below may reference images not displayed]

FINDINGS: There is no evidence of fracture or dislocation. Normal alignment
and joint spaces. There is no evidence of arthropathy or other focal
bone abnormality. Soft tissues are unremarkable.
IMPRESSION: Negative radiographs of the right shoulder.
